# Patient Record
Sex: Male | Born: 1980 | Hispanic: Yes | Marital: Married | Smoking: Never smoker
Health system: Southern US, Community
[De-identification: ages and names within clinical notes are randomized; demographics above are authoritative.]

## PROBLEM LIST (undated history)

## (undated) HISTORY — PX: APPENDECTOMY: SHX54

## (undated) HISTORY — PX: HERNIA REPAIR: SHX51

---

## 2015-01-25 ENCOUNTER — Emergency Department (HOSPITAL_COMMUNITY)
Admission: EM | Admit: 2015-01-25 | Discharge: 2015-01-25 | Disposition: A | Discharge status: Home / Self Care | Payer: Self-pay | Attending: Emergency Medicine | Admitting: Emergency Medicine

## 2015-01-25 ENCOUNTER — Emergency Department (HOSPITAL_COMMUNITY): Payer: Self-pay

## 2015-01-25 ENCOUNTER — Encounter (HOSPITAL_COMMUNITY): Payer: Self-pay | Admitting: Emergency Medicine

## 2015-01-25 DIAGNOSIS — R0602 Shortness of breath: Secondary | ICD-10-CM | POA: Insufficient documentation

## 2015-01-25 DIAGNOSIS — R300 Dysuria: Secondary | ICD-10-CM | POA: Insufficient documentation

## 2015-01-25 DIAGNOSIS — R740 Nonspecific elevation of levels of transaminase and lactic acid dehydrogenase [LDH]: Secondary | ICD-10-CM | POA: Insufficient documentation

## 2015-01-25 DIAGNOSIS — R7401 Elevation of levels of liver transaminase levels: Secondary | ICD-10-CM

## 2015-01-25 DIAGNOSIS — R509 Fever, unspecified: Secondary | ICD-10-CM | POA: Insufficient documentation

## 2015-01-25 DIAGNOSIS — R51 Headache: Secondary | ICD-10-CM | POA: Insufficient documentation

## 2015-01-25 DIAGNOSIS — R1032 Left lower quadrant pain: Secondary | ICD-10-CM | POA: Insufficient documentation

## 2015-01-25 DIAGNOSIS — Z9049 Acquired absence of other specified parts of digestive tract: Secondary | ICD-10-CM | POA: Insufficient documentation

## 2015-01-25 DIAGNOSIS — M549 Dorsalgia, unspecified: Secondary | ICD-10-CM

## 2015-01-25 DIAGNOSIS — M545 Low back pain: Secondary | ICD-10-CM | POA: Insufficient documentation

## 2015-01-25 DIAGNOSIS — R1031 Right lower quadrant pain: Secondary | ICD-10-CM | POA: Insufficient documentation

## 2015-01-25 LAB — CBC WITH DIFFERENTIAL/PLATELET
Basophils Absolute: 0 10*3/uL (ref 0.0–0.1)
Basophils Relative: 1 %
EOS ABS: 0.2 10*3/uL (ref 0.0–0.7)
EOS PCT: 4 %
HCT: 42.9 % (ref 39.0–52.0)
Hemoglobin: 14.8 g/dL (ref 13.0–17.0)
LYMPHS ABS: 2 10*3/uL (ref 0.7–4.0)
Lymphocytes Relative: 39 %
MCH: 30.5 pg (ref 26.0–34.0)
MCHC: 34.5 g/dL (ref 30.0–36.0)
MCV: 88.5 fL (ref 78.0–100.0)
MONO ABS: 0.4 10*3/uL (ref 0.1–1.0)
Monocytes Relative: 8 %
Neutro Abs: 2.5 10*3/uL (ref 1.7–7.7)
Neutrophils Relative %: 48 %
PLATELETS: 219 10*3/uL (ref 150–400)
RBC: 4.85 MIL/uL (ref 4.22–5.81)
RDW: 12.9 % (ref 11.5–15.5)
WBC: 5.1 10*3/uL (ref 4.0–10.5)

## 2015-01-25 LAB — COMPREHENSIVE METABOLIC PANEL
ALT: 89 U/L — ABNORMAL HIGH (ref 17–63)
ANION GAP: 11 (ref 5–15)
AST: 51 U/L — ABNORMAL HIGH (ref 15–41)
Albumin: 4.1 g/dL (ref 3.5–5.0)
Alkaline Phosphatase: 85 U/L (ref 38–126)
BUN: 7 mg/dL (ref 6–20)
CHLORIDE: 100 mmol/L — AB (ref 101–111)
CO2: 25 mmol/L (ref 22–32)
Calcium: 9 mg/dL (ref 8.9–10.3)
Creatinine, Ser: 0.84 mg/dL (ref 0.61–1.24)
GFR calc non Af Amer: >60 mL/min (ref >60)
Glucose, Bld: 108 mg/dL — ABNORMAL HIGH (ref 65–99)
POTASSIUM: 3.6 mmol/L (ref 3.5–5.1)
SODIUM: 136 mmol/L (ref 135–145)
Total Bilirubin: 1.1 mg/dL (ref 0.3–1.2)
Total Protein: 6.7 g/dL (ref 6.5–8.1)

## 2015-01-25 LAB — URINALYSIS, ROUTINE W REFLEX MICROSCOPIC
Bilirubin Urine: NEGATIVE
Glucose, UA: NEGATIVE mg/dL
Ketones, ur: NEGATIVE mg/dL
Leukocytes, UA: NEGATIVE
NITRITE: NEGATIVE
PROTEIN: NEGATIVE mg/dL
SPECIFIC GRAVITY, URINE: 1.01 (ref 1.005–1.030)
UROBILINOGEN UA: 0.2 mg/dL (ref 0.0–1.0)
pH: 6 (ref 5.0–8.0)

## 2015-01-25 LAB — LIPASE, BLOOD: LIPASE: 21 U/L — AB (ref 22–51)

## 2015-01-25 LAB — URINE MICROSCOPIC-ADD ON

## 2015-01-25 MED ORDER — FENTANYL CITRATE (PF) 100 MCG/2ML IJ SOLN
100.0000 ug | Freq: Once | INTRAMUSCULAR | Status: AC
  Administered 2015-01-25: 100 ug via INTRAVENOUS
  Filled 2015-01-25: qty 2

## 2015-01-25 MED ORDER — ONDANSETRON HCL 4 MG/2ML IJ SOLN
4.0000 mg | Freq: Once | INTRAMUSCULAR | Status: AC
  Administered 2015-01-25: 4 mg via INTRAVENOUS
  Filled 2015-01-25: qty 2

## 2015-01-25 MED ORDER — DIAZEPAM 5 MG/ML IJ SOLN
2.5000 mg | Freq: Once | INTRAMUSCULAR | Status: AC
  Administered 2015-01-25: 2.5 mg via INTRAVENOUS
  Filled 2015-01-25: qty 2

## 2015-01-25 MED ORDER — HYDROCODONE-ACETAMINOPHEN 5-325 MG PO TABS: 2.0000 | ORAL_TABLET | ORAL | Status: DC | PRN

## 2015-01-25 MED ORDER — GADOBENATE DIMEGLUMINE 529 MG/ML IV SOLN
15.0000 mL | Freq: Once | INTRAVENOUS | Status: AC | PRN
  Administered 2015-01-25: 15 mL via INTRAVENOUS

## 2015-01-25 MED ORDER — IOHEXOL 300 MG/ML  SOLN
100.0000 mL | Freq: Once | INTRAMUSCULAR | Status: AC | PRN
  Administered 2015-01-25: 100 mL via INTRAVENOUS

## 2015-01-25 MED ORDER — SODIUM CHLORIDE 0.9 % IV SOLN
1000.0000 mL | Freq: Once | INTRAVENOUS | Status: AC
  Administered 2015-01-25: 1000 mL via INTRAVENOUS

## 2015-01-25 MED ORDER — SODIUM CHLORIDE 0.9 % IV SOLN: 1000.0000 mL | INTRAVENOUS | Status: DC

## 2015-01-25 MED ORDER — IBUPROFEN 800 MG PO TABS: 800.0000 mg | ORAL_TABLET | Freq: Three times a day (TID) | ORAL | Status: DC

## 2015-01-25 MED ORDER — METHOCARBAMOL 500 MG PO TABS
500.0000 mg | ORAL_TABLET | Freq: Two times a day (BID) | ORAL | Status: DC | PRN
Start: 2015-01-25 — End: 2015-06-18

## 2015-01-25 NOTE — ED Notes (Signed)
Pt updated on results and plan of care with this RN and MD via interpreter phone. Pt and family verbalized understanding.

## 2015-01-25 NOTE — Discharge Instructions (Signed)
°Emergency Department Resource Guide °1) Find a Doctor and Pay Out of Pocket °Although you won't have to find out who is covered by your insurance plan, it is a good idea to ask around and get recommendations. You will then need to call the office and see if the doctor you have chosen will accept you as a new patient and what types of options they offer for patients who are self-pay. Some doctors offer discounts or will set up payment plans for their patients who do not have insurance, but you will need to ask so you aren't surprised when you get to your appointment. ° °2) Contact Your Local Health Department °Not all health departments have doctors that can see patients for sick visits, but many do, so it is worth a call to see if yours does. If you don't know where your local health department is, you can check in your phone book. The CDC also has a tool to help you locate your state's health department, and many state websites also have listings of all of their local health departments. ° °3) Find a Walk-in Clinic °If your illness is not likely to be very severe or complicated, you may want to try a walk in clinic. These are popping up all over the country in pharmacies, drugstores, and shopping centers. They're usually staffed by nurse practitioners or physician assistants that have been trained to treat common illnesses and complaints. They're usually fairly quick and inexpensive. However, if you have serious medical issues or chronic medical problems, these are probably not your best option. ° °No Primary Care Doctor: °- Call [facility] at  832-8000 - they can help you locate a primary care doctor that  accepts your insurance, provides certain services, etc. °- Physician Referral Service- 1-800-533-3463 ° °Chronic Pain Problems: °Organization         Address  Phone   Notes  °[facility]  (336) 297-2271 Patients need to be referred by their primary care doctor.  ° °Medication  Assistance: °Organization         Address  Phone   Notes  °[place] [facility] [address] °[place], [place] [address] (336) 641-8030 --Must be a resident of [place] °-- Must have NO insurance coverage whatsoever (no Medicaid/ Medicare, etc.) °-- The pt. MUST have a primary care doctor that directs their care regularly and follows them in the community °  °[facility]  (866) 331-1348   °[employer]  (888) 892-1162   ° °Agencies that provide inexpensive medical care: °Organization         Address  Phone   Notes  °[facility]  (336) 832-8035   °[facility]    (336) 832-7272   °[facility] [address] °[place], [place] [address] (336) 832-4777   °[facility] [address], °[place] (336) 271-4999   °[facility]    (336) 373-0678   °[facility]    (336) 272-1050   °[facility] ° [address], [place] Phone:  (336) 832-4444, Fax:  (336) 832-4440 Hours of Operation:  9 am - 6 pm, M-F.  Also accepts Medicaid/Medicare and self-pay.  °[facility] ° [address], [place] Phone: (336) 832-3150, Fax: (336) [phone]. Hours of Operation:  8:30 am - 5:30 pm, M-F.  Also accepts Medicaid and self-pay.  °[facility] [place] [address], [place] Phone: (336) 878-6027   °[facility] [address], [place], [place] (336)723-1848, Ext. 123 Mondays & Thursdays: 7-9 AM.  First 15 patients are seen on a first come, first serve basis. °  ° °Medicaid-accepting [place] Providers: ° °Organization         Address  Phone   Notes  °[facility] [address], [place] (336) 641-2100 Also accepts self-pay patients.  °[facility] [address], [place] ° (336) 856-9996   °[facility] [address], [place]  (336) 288-8857   °[facility] [address] [place] [address], [place] (336) 299-7000   °Veita Bland [address], [place]  ° (336) 373-1557 Only accepts [place] Access Medicaid patients after they have their name applied to their card.  ° °Self-Pay (no insurance) in [place]: ° °Organization         Address  Phone   Notes  °Sickle Cell Patients, [facility] [address], [place] (336) 832-1970   °[facility] [address], [place] (336) 832-4400   °[facility] ° [address], [place] (336) 992-4800   °[facility]/Dr. Osei-Bonsu ° [address] [place] [address], [place] or [address], [place] (336) 841-8500 Phone number for both [place] and [place] locations is the same.  °[facility] [address], [place] (336) 299-0000   °[facility] [address] [place] [address], [place] or [address] (336) 852-7530 °(336) 878-2260   °[facility] [address], [place] (336) 350-1642, phone; (336) 294-5005, fax Sees patients 1st and 3rd Saturday of every month.  Must not qualify for public or private insurance (i.e. Medicaid, Medicare, [place] Health Choice, Veterans' Benefits) • Household income should be no more than 200% of the poverty level •The clinic cannot treat you if you are pregnant or think you are pregnant • Sexually transmitted diseases are not treated at the clinic.  ° ° °Dental Care: °Organization         Address  Phone  Notes  °[place] [facility] [address], [place] (336) 641-6152 Accepts children up to age 21 who are enrolled in Medicaid or [place] Health Choice; pregnant women with a Medicaid card; and children who have applied for Medicaid or [place] Health Choice, but were declined, whose parents can pay a reduced fee at time of service.  °[place]  [facility] [place]  [address], [place] (336) 641-7733 Accepts children up to age 21 who are enrolled in Medicaid or [place] Health Choice; pregnant women with a Medicaid card; and children who have applied for Medicaid or [place] Health Choice, but were declined, whose parents can pay a reduced fee at time of service.  °[facility] ° [address], [place] (336) 641-4533 Patients are seen by appointment only. Walk-ins are not accepted. [facility] will see patients 18 years of age and older. °Monday - Tuesday (8am-5pm) °Most Wednesdays (8:30-5pm) °$30 per visit, cash only  °[facility] ° [address], [place] (336) 641-4533 Patients are seen by appointment only. Walk-ins are not accepted. [facility] will see patients 18 years of age and older. °One   Wednesday Evening (Monthly: Volunteer Based).  $30 per visit, cash only  °[facility]  (919) 537-3737 for adults; Children under age 4, call Graduate Pediatric Dentistry at ([phone]) [phone]. Children aged 4-14, please call ([phone]) [phone] to request a pediatric application. ° Dental services are provided in all areas of dental care including fillings, crowns and bridges, complete and partial dentures, implants, gum treatment, root canals, and extractions. Preventive care is also provided. Treatment is provided to both adults and children. °Patients are selected via a lottery and there is often a waiting list. °  °[facility] [address], °[place] ° (336) 763-8833 [email or web address] °  °[facility] [address], [place], [place] (336)723-1848, Ext. 123 Second and Fourth Thursday of each month, opens at 6:30 AM; Clinic ends at 9 AM.  Patients are seen on a first-come first-served basis, and a limited number are seen during each clinic.  ° °[facility] ° [address], [place], [place] (336) 723-7904    Eligibility Requirements °You must have lived in [place], [place], or [place] counties for at least the last three months. °  You cannot be eligible for state or federal sponsored healthcare insurance, including Veterans Administration, Medicaid, or Medicare. °  You generally cannot be eligible for healthcare insurance through your employer.  °  How to apply: °Eligibility screenings are held every Tuesday and Wednesday afternoon from 1:00 pm until 4:00 pm. You do not need an appointment for the interview!  °[facility] [address], [place], [place] 336-631-2330   °[facility]  336-342-8273   °[facility]  336-703-3100   °[facility]  336-570-6415   ° °Behavioral Health Resources in the Community: °Intensive Outpatient Programs °Organization         Address  Phone  Notes  °[facility] [address], [place], [place] 336-878-6098   °[facility] [address], [place], [place] 336-832-9800   °[facility] [address], [place], [place] ° 336-882-2125   °[facility] [address],  °[place], [place] 1-800-853-5163 or 336-641-4981   °Substance Abuse Resources °Organization         Address  Phone  Notes  °[facility]  336-882-2125   °[facility]  336-784-9470   °[facility]  336-285-9073   °[facility]  336-845-3988   °Residential & Outpatient Substance Abuse Program  1-800-659-3381   °Psychological Services °Organization         Address  Phone  Notes  °[facility]  336- 832-9600   °[facility]  336- 378-7881   °[facility] [address], [place] 1-800-853-5163 or 336-641-4981   ° °Mobile Crisis Teams °Organization         Address  Phone  Notes  °[facility], Mobile Crisis Care Unit  1-877-626-1772   °Assertive °[facility] ° [address]  [place], [place] 336-834-9664   °Sharon DeEsch [address] °[place] 336-554-5454   ° °Self-Help/Support Groups °Organization         Address  Phone             Notes  °[facility] of  - variety of support groups  336- 373-1402 Call for more information  °Narcotics Anonymous (NA), [facility] [address], °[place]  2 meetings at this location  ° °  Residential Treatment Programs Organization         Address  Phone  Notes  [facility] [address],    [place]  1-308-657-84691-(720) 062-1777   [facility]  [address], [place] [phone], [place], [place] [phone]   [facility] [address], [place] [phone] Admissions: 8am-3pm M-F  [facility] [address]    [place], [place] [phone]   [facility] [address], [place], [place] [phone]   [facility] [address]  [place], [place] [phone]   [facility] Intensive Outpatient [address], [place], [place] [phone]   [facility] [address]  [place], [place] [phone] or [phone]   [facility] [address] [place], [place] [phone] Accepts Medicaid  [facility] [address]  [place] [phone] Substance Abuse/Addiction Treatment   [facility]         Address  Phone  Notes  [facility]  470-448-3881(888) 365-402-2384   Angie FavaJulie Brannon, PhD [address], [address] [place], [place]   [phone](336) [phone] or (707)[phone](336) [phone]   [facility]   [address] [place], [place] [phone](336) [phone]   [facility] [address], [place], [place] [phone]) [phone] Insurance/Medicaid/sponsorship through [facility] [address]                                    [place], [place] [phone]) [phone] Therapy/tele-psych/case    [facility] [address]   [place], [place] [phone](336) [phone]    Dr. Lolly MustacheArfeen  [phone](336) [phone]   [facility] [facility]  [employer] [facility] 1) [address], [place] 2) [address], [place] 3)  [address], [place] [phone](336) [phone](336) [phone]  [phone]) [phone]   [facility] Child Abuse [facility] (910) 832-9004(336) 647-711-4391 or (424)[phone](336) 5635088188 (After Hours)      Please obtain all of your results from medical records or have your doctors office obtain the results - share them with your doctor - you should be seen at your doctors office in the next 2 days. Call today to arrange your follow up. Take the medications as prescribed. Please review all of the medicines and only take them if you do not have an allergy to them. Please be aware that if you are taking birth control pills, taking other prescriptions, ESPECIALLY ANTIBIOTICS may make the birth control ineffective - if this is the case, either do not engage in sexual activity or use alternative methods of birth control such as condoms until you have finished the medicine and your family doctor says it is OK to restart them. If you are on a blood thinner such as COUMADIN, be aware that any other medicine that you take may cause the coumadin to either work too much, or not enough - you should have your coumadin level rechecked in next 7 days if this is the case.  ?  It is also a possibility that you have an allergic reaction to any of the medicines that you have been prescribed - Everybody reacts differently to medications and while MOST people have no trouble with most medicines, you may have a reaction such as nausea, vomiting, rash, swelling, shortness of breath.  If this is the case, please stop taking the medicine immediately and contact your physician.  ?  You should return to the ER if you develop severe or worsening symptoms.

## 2015-01-25 NOTE — ED Notes (Addendum)
Pt from home and translator line used: pt states lower back pain x4 weeks with radiatio to legs. Pt also reports increased 2 weeks ago started to notice urination, dark urine production, and dysuria. Pt reports nausea but denies any emesis or diarrhea. Pt also endorsed LLQ abd pain that is tender to palpation. nad noted.

## 2015-01-25 NOTE — ED Notes (Signed)
Patient transported to MRI 

## 2015-01-25 NOTE — ED Notes (Signed)
Pt urinated 90 ml, post bladder scan 20 ml residual noted by NT

## 2015-01-25 NOTE — ED Provider Notes (Signed)
CSN: [id number]     Arrival date & time 01/25/15  1123 History   First MD Initiated Contact with Patient 01/25/15 1134     Chief Complaint  Patient presents with  . Back Pain     (Consider location/radiation/quality/duration/timing/severity/associated sxs/prior Treatment) Patient is a 34 y.o. male presenting with back pain.  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 No pertinent past medical history. Past Surgical History  Procedure Laterality Date  . Appendectomy    . Hernia repair     No family history on file. Social History  Substance Use Topics  . Smoking status: Never Smoker   . Smokeless tobacco: None  . Alcohol Use: No    Review of Systems  Constitutional: Positive for fever. Negative for chills.  HENT: Negative for congestion and rhinorrhea.   Eyes: Negative for visual disturbance.  Respiratory: Positive for shortness of breath. Negative for cough.   Cardiovascular: Negative for chest pain.  Gastrointestinal: Positive for abdominal pain. Negative for vomiting, diarrhea and constipation.  Endocrine: Negative for polyuria.  Genitourinary: Positive for dysuria. Negative for bladder incontinence, flank pain and pelvic pain.  Musculoskeletal: Positive for back pain. Negative for neck pain.  Skin: Negative for rash and wound.  Neurological: Positive for headaches. Negative for dizziness, weakness and numbness.      Allergies  Review  of patient's allergies indicates no known allergies.  Home Medications   Prior to Admission medications   Not on File   BP 133/75 mmHg  Pulse 75  Temp(Src) 98.6 F (37 C) (Oral)  Resp 16  SpO2 99% Physical Exam  Constitutional: He is oriented to person, place, and time. He appears well-developed and well-nourished.  HENT:  Head: Normocephalic and atraumatic.  Eyes: Conjunctivae and EOM are normal.  Neck: Normal range of motion. Neck supple.  Cardiovascular: Normal rate and regular rhythm.   Pulmonary/Chest: Effort normal. No respiratory distress.  Abdominal: Soft. There is tenderness (llq and rlq).  Musculoskeletal: Normal range of motion. He exhibits no edema or tenderness.  L>R cva ttp, midline lumbar ttp  Neurological: He is alert and oriented to person, place, and time.  Skin: Skin is warm and dry.  Nursing note and vitals reviewed.   ED Course  Procedures (including critical care time) Labs Review Labs Reviewed - No data to display  Imaging Review No results found. I have personally reviewed and evaluated these images and lab results as part of my medical decision-making.   EKG Interpretation None      MDM   Final diagnoses:  None   34 yo M w/ 4 weeks of indolent, progressively worsening back pain and subjective fevers. Increased urinary frequency, decreased total output. No neurologic symptoms but seems to have decreased sensation on left leg light touch. Normal reflexes and motor otherwise. Abdomen with some ttp in bilateral lower quadrants. Initial ddx was space occupying lesion in back v pyelonephritis v intraabdominal pathology such as appendicitis so started with  labs and ct scan which were negative. Pain meds improved symptoms some, valium given in case it was muscular. 2/2 triad of back pain, subjective fever and urinary changes and no other explanation I ordered MRI to eval for cauda equina from epidural abscess v osteomyelitis, discitis, etc. This was  pending at time of transfer of care. Plan is to follow up MRI results and pain improvement. If these are both ok, patient stable for discharge with symptomatic treatment.   Care to Dr. Eber Hong at approximately [phone] pending MRI results.     Marily Memos, MD 01/26/15 [phone]

## 2015-01-25 NOTE — ED Provider Notes (Signed)
Pain improved, patient informed of results, MRI shows no acute findings, stable for discharge  He was given a copy of all of his results as well as a resource list for close follow-up.  Meds given in ED:   New Prescriptions   HYDROCODONE-ACETAMINOPHEN (NORCO/VICODIN) 5-325 MG TABLET    Take 2 tablets by mouth every 4 (four) hours as needed.   IBUPROFEN (ADVIL,MOTRIN) 800 MG TABLET    Take 1 tablet (800 mg total) by mouth 3 (three) times daily.   METHOCARBAMOL (ROBAXIN) 500 MG TABLET    Take 1 tablet (500 mg total) by mouth 2 (two) times daily as needed for muscle spasms.      Eber Hong, MD 01/25/15 2021

## 2015-01-25 NOTE — ED Notes (Signed)
Patient transported to X-ray 

## 2015-01-27 MED ORDER — GADOBENATE DIMEGLUMINE 529 MG/ML IV SOLN
20.0000 mL | Freq: Once | INTRAVENOUS | Status: AC | PRN
  Administered 2015-01-25: 20 mL via INTRAVENOUS

## 2015-06-18 ENCOUNTER — Emergency Department (HOSPITAL_COMMUNITY): Payer: Self-pay

## 2015-06-18 ENCOUNTER — Encounter (HOSPITAL_COMMUNITY): Payer: Self-pay | Admitting: Family Medicine

## 2015-06-18 ENCOUNTER — Emergency Department (HOSPITAL_COMMUNITY)
Admission: EM | Admit: 2015-06-18 | Discharge: 2015-06-18 | Disposition: A | Discharge status: Home / Self Care | Payer: Self-pay | Attending: Emergency Medicine | Admitting: Emergency Medicine

## 2015-06-18 DIAGNOSIS — R0789 Other chest pain: Secondary | ICD-10-CM | POA: Insufficient documentation

## 2015-06-18 DIAGNOSIS — R109 Unspecified abdominal pain: Secondary | ICD-10-CM | POA: Insufficient documentation

## 2015-06-18 DIAGNOSIS — R319 Hematuria, unspecified: Secondary | ICD-10-CM | POA: Insufficient documentation

## 2015-06-18 DIAGNOSIS — R0781 Pleurodynia: Secondary | ICD-10-CM

## 2015-06-18 DIAGNOSIS — R110 Nausea: Secondary | ICD-10-CM | POA: Insufficient documentation

## 2015-06-18 DIAGNOSIS — R059 Cough, unspecified: Secondary | ICD-10-CM

## 2015-06-18 DIAGNOSIS — Z791 Long term (current) use of non-steroidal anti-inflammatories (NSAID): Secondary | ICD-10-CM | POA: Insufficient documentation

## 2015-06-18 DIAGNOSIS — R05 Cough: Secondary | ICD-10-CM | POA: Insufficient documentation

## 2015-06-18 DIAGNOSIS — R300 Dysuria: Secondary | ICD-10-CM | POA: Insufficient documentation

## 2015-06-18 LAB — COMPREHENSIVE METABOLIC PANEL
ALK PHOS: 92 U/L (ref 38–126)
ALT: 37 U/L (ref 17–63)
ANION GAP: 12 (ref 5–15)
AST: 28 U/L (ref 15–41)
Albumin: 4.3 g/dL (ref 3.5–5.0)
BILIRUBIN TOTAL: 1.5 mg/dL — AB (ref 0.3–1.2)
BUN: 9 mg/dL (ref 6–20)
CALCIUM: 9.5 mg/dL (ref 8.9–10.3)
CO2: 25 mmol/L (ref 22–32)
Chloride: 104 mmol/L (ref 101–111)
Creatinine, Ser: 0.89 mg/dL (ref 0.61–1.24)
GLUCOSE: 82 mg/dL (ref 65–99)
POTASSIUM: 3.5 mmol/L (ref 3.5–5.1)
Sodium: 141 mmol/L (ref 135–145)
TOTAL PROTEIN: 7.3 g/dL (ref 6.5–8.1)

## 2015-06-18 LAB — URINE MICROSCOPIC-ADD ON

## 2015-06-18 LAB — LIPASE, BLOOD: Lipase: 25 U/L (ref 11–51)

## 2015-06-18 LAB — CBC
HEMATOCRIT: 43.8 % (ref 39.0–52.0)
HEMOGLOBIN: 14.9 g/dL (ref 13.0–17.0)
MCH: 29.7 pg (ref 26.0–34.0)
MCHC: 34 g/dL (ref 30.0–36.0)
MCV: 87.3 fL (ref 78.0–100.0)
Platelets: 258 10*3/uL (ref 150–400)
RBC: 5.02 MIL/uL (ref 4.22–5.81)
RDW: 12.7 % (ref 11.5–15.5)
WBC: 4.7 10*3/uL (ref 4.0–10.5)

## 2015-06-18 LAB — URINALYSIS, ROUTINE W REFLEX MICROSCOPIC
BILIRUBIN URINE: NEGATIVE
Glucose, UA: NEGATIVE mg/dL
Hgb urine dipstick: NEGATIVE
KETONES UR: NEGATIVE mg/dL
LEUKOCYTES UA: NEGATIVE
NITRITE: NEGATIVE
PH: 7.5 (ref 5.0–8.0)
Protein, ur: 30 mg/dL — AB
SPECIFIC GRAVITY, URINE: 1.02 (ref 1.005–1.030)

## 2015-06-18 MED ORDER — HYDROCODONE-ACETAMINOPHEN 5-325 MG PO TABS: 1.0000 | ORAL_TABLET | ORAL | Status: DC | PRN

## 2015-06-18 MED ORDER — NAPROXEN 500 MG PO TABS: 500.0000 mg | ORAL_TABLET | Freq: Two times a day (BID) | ORAL | Status: DC

## 2015-06-18 MED ORDER — MORPHINE SULFATE (PF) 4 MG/ML IV SOLN
4.0000 mg | Freq: Once | INTRAVENOUS | Status: AC
Start: 2015-06-18 — End: 2015-06-18
  Administered 2015-06-18: 4 mg via INTRAVENOUS
  Filled 2015-06-18: qty 1

## 2015-06-18 MED ORDER — ONDANSETRON HCL 4 MG/2ML IJ SOLN
4.0000 mg | Freq: Once | INTRAMUSCULAR | Status: AC
  Administered 2015-06-18: 4 mg via INTRAVENOUS
  Filled 2015-06-18: qty 2

## 2015-06-18 MED ORDER — CYCLOBENZAPRINE HCL 10 MG PO TABS: 10.0000 mg | ORAL_TABLET | Freq: Two times a day (BID) | ORAL | Status: DC | PRN

## 2015-06-18 NOTE — ED Notes (Signed)
Translator used to go over discharge instructions.   

## 2015-06-18 NOTE — ED Notes (Signed)
Pt here for left flank pain/abd pain x 3 days. sts hurts when he takes a deep breath and moves. sts he has been coughing.

## 2015-06-18 NOTE — ED Notes (Signed)
Patient transported to CT 

## 2015-06-18 NOTE — ED Notes (Signed)
Patient transported to X-ray 

## 2015-06-18 NOTE — ED Provider Notes (Signed)
CSN: [id number]     Arrival date & time 06/18/15  1804 History   First MD Initiated Contact with Patient 06/18/15 2020     Chief Complaint  Patient presents with  . Flank Pain     (Consider location/radiation/quality/duration/timing/severity/associated sxs/prior Treatment) HPI Social 35 year old male who presents emergency Department with chief complaint of left flank and rib pain. He is Spanish-speaking and an interpreter is used. The patient states that he has had a cough. He developed pain in his left flank "above the kidney." He states that he has had some intermittent bouts of severe pain. At times it is stabbing and burning in sensation. He also has pain in the left anterior chest wall. He also complains of dysuria today. During his emergency visit. This pain has been uncontrolled with over-the-counter medications. He denies history of kidney stones, fevers, chills, vomiting. He did have nausea yesterday. He has not noticed any hematuria, or urgency or frequency to urinate History reviewed. No pertinent past medical history. Past Surgical History  Procedure Laterality Date  . Appendectomy    . Hernia repair     History reviewed. No pertinent family history. Social History  Substance Use Topics  . Smoking status: Never Smoker   . Smokeless tobacco: None  . Alcohol Use: No    Review of Systems Ten systems reviewed and are negative for acute change, except as noted in the HPI.     Allergies  Review of patient's allergies indicates no known allergies.  Home Medications   Prior to Admission medications   Medication Sig Start Date End Date Taking? Authorizing Provider  HYDROcodone-acetaminophen (NORCO/VICODIN) 5-325 MG tablet Take 2 tablets by mouth every 4 (four) hours as needed. 01/25/15   Eber Hong, MD  ibuprofen (ADVIL,MOTRIN) 800 MG tablet Take 1 tablet (800 mg total) by mouth 3 (three) times daily. 01/25/15   Eber Hong, MD  methocarbamol (ROBAXIN) 500 MG tablet Take 1  tablet (500 mg total) by mouth 2 (two) times daily as needed for muscle spasms. 01/25/15   Eber Hong, MD   BP 109/76 mmHg  Pulse 83  Temp(Src) 98.1 F (36.7 C) (Oral)  Resp 18  SpO2 98% Physical Exam  Constitutional: He appears well-developed and well-nourished. No distress.  HENT:  Head: Normocephalic and atraumatic.  Eyes: Conjunctivae are normal. No scleral icterus.  Neck: Normal range of motion. Neck supple.  Cardiovascular: Normal rate, regular rhythm and normal heart sounds.   Pulmonary/Chest: Effort normal and breath sounds normal. No respiratory distress. He exhibits tenderness.  Abdominal: Soft. He exhibits no distension (tender to palpation over the left anterior chest wall) and no mass. There is tenderness (left CVA tenderness). There is no guarding.  Musculoskeletal: He exhibits no edema.  Neurological: He is alert.  Skin: Skin is warm and dry. He is not diaphoretic.  Psychiatric: His behavior is normal.  Nursing note and vitals reviewed.   ED Course  Procedures (including critical care time) Labs Review Labs Reviewed  COMPREHENSIVE METABOLIC PANEL - Abnormal; Notable for the following:    Total Bilirubin 1.5 (*)    All other components within normal limits  URINALYSIS, ROUTINE W REFLEX MICROSCOPIC (NOT AT [facility]) - Abnormal; Notable for the following:    Color, Urine AMBER (*)    Protein, ur 30 (*)    All other components within normal limits  URINE MICROSCOPIC-ADD ON - Abnormal; Notable for the following:    Squamous Epithelial / LPF 0-5 (*)    Bacteria, UA MANY (*)  All other components within normal limits  LIPASE, BLOOD  CBC    Imaging Review No results found. I have personally reviewed and evaluated these images and lab results as part of my medical decision-making.   EKG Interpretation None      MDM   Final diagnoses:  Rib pain on left side  Hematuria  Cough    10:54 PM BP 109/76 mmHg  Pulse 83  Temp(Src) 98.1 F (36.7 C) (Oral)   Resp 18  SpO2 98% Patient's imaging is negative.  This likelyu represents musculoskeletal pain.  He is PERC negative. Will dc with symptomatic relief and discussed return precautions.   Arthor Captain, PA-C 06/20/15 2135  Arby Barrette, MD 06/29/15 [phone]

## 2015-06-18 NOTE — Discharge Instructions (Signed)
Dolor en la pared torcica (Chest Wall Pain) El dolor en la pared torcica se produce en los huesos y los msculos del pecho o alrededor de Scientist, product/process development. A veces, una lesin Occupational psychologist. En ocasiones, la causa puede ser desconocida. Este dolor puede durar varias semanas. INSTRUCCIONES PARA EL CUIDADO EN EL HOGAR  Est atento a cualquier cambio en los sntomas. Tome estas medidas para Acupuncturist dolor:   Haga reposo como se lo haya indicado el mdico.   Evite las actividades que causan dolor. Estas pueden ser [employer] requieren el uso de los msculos del trax, los abdominales o los laterales para levantar objetos pesados.   Si se lo indican, aplique hielo sobre la zona dolorida:  Ponga el hielo en una bolsa plstica.  Coloque una toalla entre la piel y la bolsa de hielo.  Coloque el hielo durante , 2 a 3veces por Futures trader.  Tome los medicamentos de venta libre y los recetados solamente como se lo haya indicado el mdico.  No consuma productos que contengan tabaco, incluidos cigarrillos, tabaco de Theatre manager y Administrator, Civil Service. Si necesita ayuda para dejar de fumar, consulte al mdico.  Concurra a todas las visitas de control como se lo haya indicado el mdico. Esto es importante. SOLICITE ATENCIN MDICA SI:  Lance Muss.  El dolor de [place].  Aparecen nuevos sntomas. SOLICITE ATENCIN MDICA DE INMEDIATO SI:  Tiene nuseas o vmitos.  Berenice Primas o tiene sensacin de desvanecimiento.  Tiene tos con flema (esputo) o expectora sangre al toser.  Le falta el aire.   Esta informacin no tiene Theme park manager el consejo del mdico. Asegrese de hacerle al mdico cualquier pregunta que tenga.   Document Released: 05/17/2006 Document Revised: 12/25/2014 Elsevier Interactive Patient Education 2016 [employer].  Tos en los adultos (Cough, Adult) La tos es un reflejo que limpia la garganta y las vas respiratorias, y ayuda a la curacin y Training and development officer  de los pulmones. Es normal toser de Teacher, English as a foreign language, pero cuando esta se presenta con otros sntomas o dura mucho tiempo puede ser el signo de una enfermedad que [place]. La tos puede durar solo 2 o 3semanas (aguda) o ms de 8semanas (crnica). CAUSAS Comnmente, las causas de la tos son las siguientes:  Visual merchandiser sustancias que [employer].  Una infeccin respiratoria viral o bacteriana.  Alergias.  Asma.  Goteo posnasal.  Fumar.  El retroceso de cido estomacal hacia el esfago (reflujo gastroesofgico).  Algunos medicamentos.  Los problemas pulmonares crnicos, entre ellos, la enfermedad pulmonar obstructiva crnica (EPOC) (o, en contadas ocasiones, el cncer de pulmn).  Otras afecciones, como la insuficiencia cardaca. INSTRUCCIONES PARA EL CUIDADO EN EL HOGAR  Est atento a cualquier cambio en los sntomas. Tome estas medidas para Paramedic las molestias:  Tome los medicamentos solamente como se lo haya indicado el mdico.  Si le recetaron un antibitico, tmelo como se lo haya indicado el mdico. No deje de tomar los antibiticos aunque comience a sentirse mejor.  Hable con el mdico antes de tomar un antitusivo.  Beba suficiente lquido para Photographer orina clara o de color amarillo plido.  Si el aire est seco, use un vaporizador o un humidificador con vapor fro en su habitacin o en su casa para ayudar a aflojar las secreciones.  Evite todas las cosas que le producen tos en el trabajo o en su casa.  Si la tos aumenta durante la noche, intente dormir semisentado.  Evite el humo del cigarrillo. Si  fuma, deje de hacerlo. Si necesita ayuda para dejar de fumar, consulte al mdico.  Evite la cafena.  Evite el alcohol.  Descanse todo lo que sea necesario. SOLICITE ATENCIN MDICA SI:   Aparecen nuevos sntomas.  Expectora pus al toser.  La tos no mejora despus de 2 o 3semanas, o empeora.  No puede controlar la tos con antitusivos y no  puede dormir bien.  Tiene un dolor que se intensifica o que no puede Sales promotion account executive.  Tiene fiebre.  [place] de peso sin causa aparente.  Tiene transpiracin nocturna. SOLICITE ATENCIN MDICA DE INMEDIATO SI:  Tose y escupe sangre.  Tiene dificultad para respirar.  Los latidos cardacos son muy rpidos.   Esta informacin no tiene Theme park manager el consejo del mdico. Asegrese de hacerle al mdico cualquier pregunta que tenga.   Document Released: 11/11/2010 Document Revised: 12/25/2014 Elsevier Interactive Patient Education 2016 [employer].  Hematuria - Adultos (Hematuria, Adult) La hematuria es la presencia de sangre en la orina. La causa puede ser una infeccin en la vejiga, en los riones, en la prstata, clculos renales o cncer de las vas urinarias. Normalmente las infecciones pueden tratarse con medicamentos, y los clculos renales, por lo general, se eliminarn a travs de la orina. Si ninguna de estas es la causa de su hematuria, quizs se necesiten ms estudios para Building services engineer. Es muy importante que le informe a su mdico si ve sangre en la [place], aunque el sangrado se detenga sin tratamiento o no cause dolor. La sangre que aparece en la orina y luego se detiene y vuelve a aparecer nuevamente puede ser un sntoma de una enfermedad muy grave. Adems, el dolor no es un sntoma en las etapas iniciales de muchos tipos de cncer urinarios. INSTRUCCIONES PARA EL CUIDADO EN EL HOGAR   Beba mucho lquido, entre 3 a Risk analyst. Si le han diagnosticado una infeccin, se recomienda especialmente el jugo de arndanos rojos, [address] de grandes cantidades de [place].  Evite la cafena, el t y las bebidas con gas porque suelen irritar la vejiga.  Evite el alcohol ya que puede Teacher, adult education.  Tome todos los [employer] se lo haya indicado el mdico.  Si le recetaron antibiticos, asegrese de terminarlos, incluso si comienza a sentirse  mejor.  Si le han diagnosticado clculos renales, siga las instrucciones de su mdico con respecto a Ecologist la orina para TEFL teacher clculo.  Vace la vejiga con frecuencia. Evite retener la orina durante largos perodos.  Despus de defecar, las mujeres deben higienizarse desde adelante hacia atrs. Use solo un papel por vez.  Si es [employer], vace la vejiga antes y despus de Management consultant. SOLICITE ATENCIN MDICA SI:  Siente dolor en la espalda.  Tiene fiebre.  Tiene sensacin de Programme researcher, broadcasting/film/video (nuseas) o vmitos.  Los sntomas no mejoran despus de [address]. Si la condicin empeora, visite al mdico antes de lo previsto. SOLICITE ATENCIN MDICA DE INMEDIATO SI:   Vomita con frecuencia e intensamente y no puede tolerar los medicamentos.  Siente un dolor intenso en la espalda o el abdomen incluso tomando los medicamentos.  Elimina cogulos grandes o sangre con la [place].  Se siente extremadamente dbil, se desmaya o pierde el conocimiento. ASEGRESE DE QUE:   Comprende estas instrucciones.  Controlar su afeccin.  Recibir ayuda de inmediato si no mejora o si empeora.   Esta informacin no tiene Theme park manager el consejo del mdico. Asegrese de hacerle al  mdico cualquier pregunta que tenga.   Document Released: 04/05/2005 Document Revised: 04/26/2014 Elsevier Interactive Patient Education 2016 Elsevier Inc. Naproxen and naproxen sodium oral immediate-release tablets Qu es este medicamento? El NAPROXENO es un medicamento antiinflamatorio no esteroideo (AINE). Se utiliza para reducir la inflamacin y Corporate treasurer. Este medicamento se puede Chemical engineer para Nurse, adult, dolor de [place] y perodos menstruales dolorosos. Este medicamento tambin sirve para tratar problemas dolorosos de las articulaciones y los msculos, tales como artritis, tendinitis, bursitis y Secondary school teacher. Este medicamento puede ser utilizado para otros usos; si tiene alguna  pregunta consulte con su proveedor de atencin mdica o con su farmacutico. Qu le debo informar a mi profesional de la salud antes de tomar este medicamento? Necesita saber si usted presenta alguno de los siguientes problemas o situaciones: -asma -fuma -consume ms de 3 bebidas alcohlicas por da -enfermedad cardiaca o problemas circulatorios, tales como insuficiencia cardiaca o edema de pierna (retencin de lquido) -alta presin sangunea -enfermedad renal -enfermedad heptica -lceras o sangrado estomacal -una reaccin alrgica o inusual al naproxeno, a la aspirina, a otros AINE, otros medicamentos, alimentos, colorantes o conservantes -si est embarazada o buscando quedar embarazada -si est amamantando a un beb Cmo debo [employer]? Tome este medicamento por va oral con un vaso de agua. Siga las instrucciones de la etiqueta del [place]. Si este medicamento le produce Programme researcher, broadcasting/film/video, tmelo con alimentos. Trate de no acostarse por lo menos 10 minutos despus de tomarlo. Tome sus dosis a intervalos regulares. No tome su medicamento con una frecuencia mayor a la indicada. El uso prolongado puede aumentar el riesgo de sufrir un ataque cardiaco o un derrame cerebral. Su farmacutico le dar una [place] del medicamento especial con cada receta y relleno. Asegrese de leer esta informacin cada vez cuidadosamente. Hable con su pediatra para informarse acerca del uso de este medicamento en nios. Puede requerir atencin especial. Sobredosis: Pngase en contacto inmediatamente con un centro toxicolgico o una sala de urgencia si usted cree que haya tomado demasiado medicamento. ATENCIN: [employer] es solo para usted. No comparta este medicamento con nadie. Qu sucede si me olvido de una dosis? Si olvida una dosis, tmela lo antes posible. Si es casi la hora de la prxima dosis, tome slo esa dosis. No tome dosis adicionales o dobles. Qu puede interactuar con  este medicamento? -alcohol -aspirina -cidofovir -diurticos -litio -metotrexato -otros medicamentos antiinflamatorios, tales Teacher, English as a foreign language o prednisona -pemetrexed -probenecid -warfarina Puede ser que esta lista no menciona todas las posibles interacciones. Informe a su profesional de [employer] de [employer] productos a base de hierbas, medicamentos de [place] o suplementos nutritivos que est tomando. Si usted fuma, consume bebidas alcohlicas o si utiliza drogas ilegales, indqueselo tambin a su profesional de [employer]. Algunas sustancias pueden interactuar con su medicamento. A qu debo estar atento al usar [employer]? Si el dolor no mejora, informe a su mdico o a su profesional de [employer]. Consulte con su mdico antes de tomar otros analgsicos. No se trate usted mismo. Este medicamento no previene ataques cardiacos o derrames cerebrales. De hecho, este medicamento puede aumentar la posibilidad de Marine scientist un ataque cardiaco o un derrame cerebral. La posibilidad puede aumentar con el uso prolongado de este medicamento y en pacientes con enfermedad cardiaca. Si est tomando aspirina para la prevencin de ataques cardiacos o derrames cerebrales, comunquese con su mdico o su profesional de [employer]. Evite tomar otros medicamentos que contienen aspirina, ibuprofeno o naproxeno con [employer].  Es probable que se Special educational needs teacher secundarios, tales como molestias estomacales, nuseas o lceras. No debe de tomar [employer] con muchos medicamentos disponibles de [employer]. Este medicamento puede provocar lceras y hemorragia del estmago e intestinos en cualquier momento durante tratamiento. No fume ni ingiera alcohol. Esto irrita an ms el estmago y puede hacerlo ms susceptible a dao por el uso de [employer]. Pueden ocurrir lceras y hemorragia sin sntomas de Control and instrumentation engineer y Financial controller la [place]. Puede experimentar mareos o somnolencia. No conduzca ni utilice  maquinaria ni haga nada que Scientist, research (life sciences) en estado de alerta hasta que sepa cmo le afecta este medicamento. No se siente ni se ponga de pie con rapidez, especialmente si es un paciente de edad avanzada. Esto reduce el riesgo de mareos o [employer]. Este medicamento puede hacerle sangrar con mayor facilidad. Trate de no lastimarse los dientes y las encas al cepillarlos o limpiarlos con hilo dental. Qu efectos secundarios puedo tener al [employer] este medicamento? Efectos secundarios que debe informar a su mdico o a Producer, television/film/video de la salud tan pronto como sea posible: -heces de color oscuro o con sangre, sangre en la orina o vmito con sangre -visin borrosa -dolor en el pecho -dificultad al respirar o sibilancias -nuseas o vmito -dolor de estmago severo -erupcin cutnea, enrojecimiento, ampollas o descamacin de la piel, urticarias o picazn -hablar arrastrando las palabras o debilidad en un lado del cuerpo -hinchazn de prpados, garganta o labios -aumento de peso o hinchazn que no tienen explicacin -cansancio o debilidad inusual -color amarillento de los ojos o la piel Efectos secundarios que, por lo general, no requieren atencin mdica (debe informarlos a su mdico o a su profesional de la salud si persisten o si son molestos): -estreimiento -dolor de cabeza -acidez de estmago Puede ser que esta lista no menciona todos los posibles efectos secundarios. Comunquese a su mdico por asesoramiento mdico [employer]. Usted puede informar los efectos secundarios a la FDA por telfono al 1-800-FDA-1088. Dnde debo guardar mi medicina? Mantngala fuera del alcance de los nios. Gurdela a [employer], entre 15 y 30 grados C (10 y 7 grados F). Mantenga le envase bien cerrado. Deseche todo el medicamento que no haya utilizado, despus de la fecha de vencimiento. ATENCIN: Este folleto es un resumen. Puede ser que no cubra toda la posible informacin.  Si usted tiene preguntas acerca de esta medicina, consulte con su mdico, su farmacutico o su profesional de Radiographer, therapeutic.    2016, Elsevier/Gold Standard. (2014-05-29 00:00:00) Acetaminophen; Hydrocodone tablets or capsules Qu es este medicamento? ACETAMINOFENO; HIDROCODONA es un analgsico. Se utiliza para tratar los dolores moderados a severos. Este medicamento puede ser utilizado para otros usos; si tiene alguna pregunta consulte con su proveedor de atencin mdica o con su farmacutico. Qu le debo informar a mi profesional de la salud antes de tomar este medicamento? Necesita saber si usted presenta alguno de los [employer] o situaciones: -tumor cerebral -enfermedad de [place], enfermedad intestinal inflamatoria o colitis ulcerativa -abuso de drogas o drogadiccin -lesin de la cabeza -problemas cardiacos o circulatorios -si consume alcohol con frecuencia -enfermedad renal o problemas al orinar -enfermedad heptica -enfermedad pulmonar, asma o dificultades al respirar -una reaccin alrgica o inusual al acetaminofeno, a la hidrocodona, a otros analgsicos opiceos, a otros medicamentos, alimentos, colorantes o conservantes -si est embarazada o buscando quedar embarazada -si est amamantando a un beb Cmo debo utilizar este medicamento? Tome este medicamento por va oral. Ingiralo con un vaso  lleno de agua. Siga las instrucciones de la etiqueta del [place]. Si el Social worker, tmelo con alimentos o con [place]. No tome su medicamento con una frecuencia mayor que la indicada. Hable con su pediatra para informarse acerca del uso de este medicamento en nios. Este medicamento no est aprobado para uso en nios. Los [employer]  de 65 aos de edad pueden presentar reacciones ms fuertes y Pension scheme manager dosis [employer]. Sobredosis: Pngase en contacto inmediatamente con un centro toxicolgico o una sala de urgencia si usted cree que haya tomado  demasiado medicamento. ATENCIN: [employer] es solo para usted. No comparta este medicamento con nadie. Qu sucede si me olvido de una dosis? Si olvida una dosis, tmela lo antes posible. Si es casi la hora de la prxima dosis, tome slo esa dosis. No tome dosis adicionales o dobles. Qu puede interactuar con este medicamento? -alcohol -antihistamnicos -isoniazida -medicamentos para la depresin, ansiedad u trastornos psicticos -medicamentos para dormir -relajantes musculares -naltrexona -medicamentos narcticos (opiceos) para el dolor -fenobarbital -ritonavir -tramadol Puede ser que esta lista no menciona todas las posibles interacciones. Informe a su profesional de [employer] de [employer] productos a base de hierbas, medicamentos de [place] o suplementos nutritivos que est tomando. Si usted fuma, consume bebidas alcohlicas o si utiliza drogas ilegales, indqueselo tambin a su profesional de [employer]. Algunas sustancias pueden interactuar con su medicamento. A qu debo estar atento al usar [employer]? Si el dolor no desaparece, si empeora o si experimenta un dolor nuevo o de tipo diferente, consulte a su mdico o a su profesional de [employer]. Usted puede desarrollar tolerancia al medicamento. La tolerancia significa que necesitar una dosis ms alta para Engineer, materials. Tolerancia es normal y esperada cuando est tomando este medicamento por un largo perodo de [place]. No suspenda el uso de su medicamento repentinamente debido a que puede Copywriter, advertising reaccin severa. Su cuerpo se acostumbra a Industrial/product designer. Esto NO significa que sea adicto. La adiccin es un comportamiento que hace referencia a la obtencin y utilizacin de un medicamento con fines que no son mdicos. Si tiene Engineer, mining, existe una razn mdica para que usted tome un analgsico. Su mdico le indicar la cantidad de medicamento que Mudlogger. Si su mdico desea que [employer], la dosis  ser reducida gradualmente para Psychiatric nurse secudarios. Puede experimentar somnolencia o mareos al comenzar con el medicamento o al cambiar de dosis. No conduzca ni utilice maquinaria ni haga nada que sea peligroso hasta que sepa cmo le afecta este medicamento. Sintese y pngase de pie lentamente. Hay distintos tipos de medicamentos narcticos (opiceos) para Chief Technology Officer. Si usted toma ms que un tipo a la [employer], podr tener ms [employer]. Dar a su proveedor de atencin medica una lista de todos los medicamentos que usted [place]. Su mdico le informar la cantidad de medicamento que Loss adjuster, chartered. No tome ms medicamento que lo indicado. Comunquese con emergencia para ayuda si tiene problemas para respirar. Este medicamento causar estreimiento. Trate de evacuar los intestinos al menos cada 2  3 das. Si no evacua los intestinos durante 3 [address]  [address], comunquese con su mdico o con su profesional de [employer]. Tomando mucho acetaminofeno puede ser muy peligroso. No tome Tylenol (acetaminofeno) u otros medicamentos que contienen acetaminofeno con este medicamento. Muchos medicamentos de venta libre contienen acetaminofeno. Lea siempre las etiquetas cuidadosamente. Qu efectos secundarios puedo tener al [employer] este medicamento? Efectos secundarios que debe informar a su mdico o  a su profesional de la salud tan pronto como sea posible: -Therapist, art como erupcin cutnea, picazn o urticarias, hinchazn de la cara, labios o lengua -problemas respiratorios -confusin -sensacin de desmayos o mareos, cadas -dolor de estmago -color amarillento de los ojos o la piel Efectos secundarios que, por lo general, no requieren atencin mdica (debe informarlos a su mdico o a su profesional de la salud si persisten o si son molestos): -nuseas, vmitos -Programme researcher, broadcasting/film/video Puede ser que esta lista no menciona todos los posibles efectos secundarios. Comunquese a su mdico por asesoramiento  mdico [employer]. Usted puede informar los efectos secundarios a la FDA por telfono al 1-800-FDA-1088. Dnde debo guardar mi medicina? Mantngala fuera del alcance de los nios. Este medicamento puede ser abusado. Mantenga su medicamento en un lugar seguro para protegerlo contra robos. No comparta este medicamento con nadie. Es peligroso vender o ceder este medicamento y est prohibido por la ley. Gurdela a [employer], entre 15 y 30 grados C (13 y 43 grados F). Protjalo de Statistician. Mantenga el envase bien cerrado. Deseche todo el medicamento que no haya utilizado, despus de la fecha de vencimiento. Deseche los medicamentos sin Chemical engineer y los envases utilizados con cuidado. Los nios y las mascotas pueden ser daados si encuentran los envases usados o perdidos. ATENCIN: Este folleto es un resumen. Puede ser que no cubra toda la posible informacin. Si usted tiene preguntas acerca de esta medicina, consulte con su mdico, su farmacutico o su profesional de Radiographer, therapeutic.    2016, Elsevier/Gold Standard. (2014-05-28 00:00:00) Cyclobenzaprine tablets Qu es este medicamento? La CICLOBENZAPRINA es un relajante muscular. Se utiliza para tratar [employer] y la rigidez de los msculos y espasmos musculares. Este medicamento puede ser utilizado para otros usos; si tiene alguna pregunta consulte con su proveedor de atencin mdica o con su farmacutico. Qu le debo informar a mi profesional de la salud antes de tomar este medicamento? Necesita saber si usted presenta alguno de los siguientes problemas o situaciones: -enfermedad cardiaca, pulso cardiaco irregular o ataque cardiaco previo -enfermedad heptica -problemas tiroideos -una reaccin alrgica o inusual a la ciclobenzaprina, antidepresivos tricclicos, lactosa, a otros medicamentos, alimentos, colorantes o conservadores -si est embarazada o buscando quedar embarazada -si est amamantando a un beb Cmo debo utilizar  este medicamento? Tome este medicamento por va oral con un vaso de agua. Siga las instrucciones de la etiqueta del [place]. Si este Social worker, tmelo con alimentos o con [employer]. No tome su medicamento con una frecuencia mayor a la indicada. Hable con su pediatra para informarse acerca del uso de este medicamento en nios. Puede requerir atencin especial. Sobredosis: Pngase en contacto inmediatamente con un centro toxicolgico o una sala de urgencia si usted cree que haya tomado demasiado medicamento. ATENCIN: [employer] es solo para usted. No comparta este medicamento con nadie. Qu sucede si me olvido de una dosis? Si olvida una dosis, tmela lo antes posible. Si es casi la hora de su dosis siguiente, tome slo esa dosis. No tome dosis adicionales o dobles. Qu puede interactuar con este medicamento? No tome esta medicina con ninguno de los siguientes medicamentos: -ciertos medicamentos para infecciones micticas, tales como fluconazol, quetoconazol, itraconazol, posaconazol, voriconazol -cisapride -dofetilida -dronedarona -droperidol -flecainida -grepafloxacino -halofantrina -levometadilo -IMAOs tales como Carbex, Eldepryl, Marplan, Nardil y [place] -nilotinib -pimozida -probucol -sertindol -tioridazina -ziprasidona Esta medicina tambin puede interactuar con los siguientes medicamentos: -abarlix -alcohol -ciertos medicamentos para el cncer -ciertos medicamentos para la depresin, ansiedad  o trastornos psicticos -ciertos medicamentos para infecciones, tales como alfuzosn, cloroquina, claritromicina, levofloxacino, mefloquina, pentamidina, troleandomicina -ciertos medicamentos para el pulso cardiaco irregular -ciertos medicamentos usados para hacerle dormir o para entumecer durante una ciruga o procedimiento -colorantes de Arts development officer -dolasetrn -guanetidina -metadona -octreotida -ondansetrn -otros medicamentos que prolongan  el intervalo QT (causa un ritmo cardiaco anormal) -palonosetrn -fenotiazinas, tales como clorpromacina, mesoridazina, proclorperazina, tioridazina -tramadol -vardenafil Puede ser que esta lista no menciona todas las posibles interacciones. Informe a su profesional de [employer] de [employer] productos a base de hierbas, medicamentos de [place] o suplementos nutritivos que est tomando. Si usted fuma, consume bebidas alcohlicas o si utiliza drogas ilegales, indqueselo tambin a su profesional de [employer]. Algunas sustancias pueden interactuar con su medicamento. A qu debo estar atento al usar [employer]? Consulte a su mdico o a su profesional de la salud si su problema no mejora en 1 a 3 semanas. Puede experimentar somnolencia o mareos al comenzar con el medicamento o al cambiar de dosis. No conduzca ni utilice maquinaria, ni haga nada que sea peligroso hasta que sepa cmo le afecta este medicamento. Sintese y pngase de pie lentamente. Se le podr secar la boca. Masticar chicle sin azcar, chupar caramelos duros y beber agua le ayudarn a Pharmacologist la boca hmeda. Qu efectos secundarios puedo tener al [employer] este medicamento? Efectos secundarios que debe informar a su mdico o a Producer, television/film/video de la salud tan pronto como sea posible: -Therapist, art como erupcin cutnea, picazn o urticarias, hinchazn de la cara, labios o lengua -dolor en el pecho -pulso cardiaco rpido -alucinaciones -convulsiones -vmito Efectos secundarios que, por lo general, no requieren atencin mdica (debe informarlos a su mdico o a su profesional de la salud si persisten o si son molestos): -dolor de cabeza Puede ser que esta lista no menciona todos los posibles efectos secundarios. Comunquese a su mdico por asesoramiento mdico [employer]. Usted puede informar los efectos secundarios a la FDA por telfono al 1-800-FDA-1088. Dnde debo guardar mi medicina? Mantngala  fuera del alcance de los nios. Gurdela a [employer], entre 15 y 30 grados C (83 y 1 grados F). Mantenga el envase bien cerrado. Deseche todo el medicamento que no haya utilizado, despus de la fecha de vencimiento. ATENCIN: Este folleto es un resumen. Puede ser que no cubra toda la posible informacin. Si usted tiene preguntas acerca de esta medicina, consulte con su mdico, su farmacutico o su profesional de Radiographer, therapeutic.    2016, Elsevier/Gold Standard. (2014-05-28 00:00:00)

## 2016-05-19 ENCOUNTER — Emergency Department (HOSPITAL_COMMUNITY)
Admission: EM | Admit: 2016-05-19 | Discharge: 2016-05-19 | Disposition: A | Discharge status: Home / Self Care | Payer: Self-pay | Attending: Emergency Medicine | Admitting: Emergency Medicine

## 2016-05-19 ENCOUNTER — Encounter (HOSPITAL_COMMUNITY): Payer: Self-pay | Admitting: Emergency Medicine

## 2016-05-19 DIAGNOSIS — Z79899 Other long term (current) drug therapy: Secondary | ICD-10-CM | POA: Insufficient documentation

## 2016-05-19 DIAGNOSIS — R1084 Generalized abdominal pain: Secondary | ICD-10-CM | POA: Insufficient documentation

## 2016-05-19 DIAGNOSIS — R112 Nausea with vomiting, unspecified: Secondary | ICD-10-CM | POA: Insufficient documentation

## 2016-05-19 LAB — URINALYSIS, ROUTINE W REFLEX MICROSCOPIC
Bilirubin Urine: NEGATIVE
GLUCOSE, UA: NEGATIVE mg/dL
Hgb urine dipstick: NEGATIVE
KETONES UR: NEGATIVE mg/dL
LEUKOCYTES UA: NEGATIVE
NITRITE: NEGATIVE
PROTEIN: NEGATIVE mg/dL
Specific Gravity, Urine: 1.01 (ref 1.005–1.030)
pH: 7 (ref 5.0–8.0)

## 2016-05-19 LAB — CBC
HEMATOCRIT: 42.9 % (ref 39.0–52.0)
Hemoglobin: 14.7 g/dL (ref 13.0–17.0)
MCH: 30.3 pg (ref 26.0–34.0)
MCHC: 34.3 g/dL (ref 30.0–36.0)
MCV: 88.5 fL (ref 78.0–100.0)
PLATELETS: 263 10*3/uL (ref 150–400)
RBC: 4.85 MIL/uL (ref 4.22–5.81)
RDW: 12.5 % (ref 11.5–15.5)
WBC: 5.4 10*3/uL (ref 4.0–10.5)

## 2016-05-19 LAB — COMPREHENSIVE METABOLIC PANEL
ALT: 51 U/L (ref 17–63)
AST: 47 U/L — ABNORMAL HIGH (ref 15–41)
Albumin: 4.5 g/dL (ref 3.5–5.0)
Alkaline Phosphatase: 83 U/L (ref 38–126)
Anion gap: 7 (ref 5–15)
BILIRUBIN TOTAL: 0.9 mg/dL (ref 0.3–1.2)
BUN: 13 mg/dL (ref 6–20)
CHLORIDE: 105 mmol/L (ref 101–111)
CO2: 27 mmol/L (ref 22–32)
CREATININE: 0.87 mg/dL (ref 0.61–1.24)
Calcium: 10 mg/dL (ref 8.9–10.3)
GFR calc non Af Amer: >60 mL/min (ref >60)
Glucose, Bld: 112 mg/dL — ABNORMAL HIGH (ref 65–99)
POTASSIUM: 4.4 mmol/L (ref 3.5–5.1)
Sodium: 139 mmol/L (ref 135–145)
TOTAL PROTEIN: 7.3 g/dL (ref 6.5–8.1)

## 2016-05-19 LAB — LIPASE, BLOOD: LIPASE: 20 U/L (ref 11–51)

## 2016-05-19 MED ORDER — DICYCLOMINE HCL 10 MG PO CAPS
10.0000 mg | ORAL_CAPSULE | Freq: Once | ORAL | Status: AC
  Administered 2016-05-19: 10 mg via ORAL
  Filled 2016-05-19: qty 1

## 2016-05-19 MED ORDER — ONDANSETRON HCL 4 MG PO TABS: 4.0000 mg | ORAL_TABLET | Freq: Four times a day (QID) | ORAL | 0 refills | Status: AC

## 2016-05-19 MED ORDER — ONDANSETRON 4 MG PO TBDP
4.0000 mg | ORAL_TABLET | Freq: Once | ORAL | Status: AC
  Administered 2016-05-19: 4 mg via ORAL
  Filled 2016-05-19: qty 1

## 2016-05-19 MED ORDER — DICYCLOMINE HCL 20 MG PO TABS: 20.0000 mg | ORAL_TABLET | Freq: Two times a day (BID) | ORAL | 0 refills | Status: AC

## 2016-05-19 NOTE — ED Notes (Signed)
Pt request something for pain, told pt I would talk to RN.

## 2016-05-19 NOTE — Discharge Instructions (Signed)
Take your medications as prescribed. Stay well-hydrated as we discussed. Follow up with your doctor as needed. Return to ED for new or worsening symptoms as we discussed

## 2016-05-19 NOTE — ED Notes (Signed)
Gave pt crackers and water for PO challenge 

## 2016-05-19 NOTE — ED Triage Notes (Signed)
abd pain  Since Monday started left side now on the rrt having n/v/d

## 2016-05-19 NOTE — ED Provider Notes (Signed)
[facility]-EMERGENCY DEPT Provider Note   CSN: [id number] Arrival date & time: 05/19/16  1033     History   Chief Complaint Chief Complaint  Patient presents with  . Abdominal Pain  . Emesis    HPI Nil Xiong is a 36 y.o. male.  HPI status post appendectomy, here for evaluation of diffuse abdominal pain, nausea and vomiting. Patient poor symptoms started on Monday evening. He reports feeling like "a ball in the middle of my stomach". He reports his discomfort is worse when he lies on either side, but is better when he lies flat. He reports associated nausea and vomiting, nonbloody and non-bilious. He has taken Alka-Seltzer without relief. Last bowel movement was this morning and normal for him. Reports subjective tactile fever last night, but has resolved. Denies any chills, chest pain, shortness of breath, diarrhea or constipation, urinary symptoms, rash.  History reviewed. No pertinent past medical history.  There are no active problems to display for this patient.   Past Surgical History:  Procedure Laterality Date  . APPENDECTOMY    . HERNIA REPAIR         Home Medications    Prior to Admission medications   Medication Sig Start Date End Date Taking? Authorizing Provider  ibuprofen (ADVIL,MOTRIN) 800 MG tablet Take 800 mg by mouth every 8 (eight) hours as needed for headache or moderate pain.   Yes Historical Provider, MD  cyclobenzaprine (FLEXERIL) 10 MG tablet Take 1 tablet (10 mg total) by mouth 2 (two) times daily as needed for muscle spasms. Patient not taking: Reported on 05/19/2016 06/18/15   Arthor Captain, PA-C  dicyclomine (BENTYL) 20 MG tablet Take 1 tablet (20 mg total) by mouth 2 (two) times daily. 05/19/16   Joycie Peek, PA-C  HYDROcodone-acetaminophen (NORCO) 5-325 MG tablet Take 1 tablet by mouth every 4 (four) hours as needed for severe pain. Patient not taking: Reported on 05/19/2016 06/18/15   Arthor Captain, PA-C  naproxen (NAPROSYN) 500 MG tablet  Take 1 tablet (500 mg total) by mouth 2 (two) times daily with a meal. Patient not taking: Reported on 05/19/2016 06/18/15   Arthor Captain, PA-C  ondansetron (ZOFRAN) 4 MG tablet Take 1 tablet (4 mg total) by mouth every 6 (six) hours. 05/19/16   Joycie Peek, PA-C    Family History No family history on file.  Social History Social History  Substance Use Topics  . Smoking status: Never Smoker  . Smokeless tobacco: Not on file  . Alcohol use No     Allergies   Patient has no known allergies.   Review of Systems Review of Systems A 10 point review of systems was completed and was negative except for pertinent positives and negatives as mentioned in the history of present illness    Physical Exam Updated Vital Signs BP 110/74   Pulse 60   Temp 98.1 F (36.7 C) (Oral)   Resp 15   SpO2 98%   Physical Exam  Constitutional: He appears well-developed and well-nourished. No distress.  Awake, alert and nontoxic in appearance  HENT:  Head: Normocephalic and atraumatic.  Right Ear: External ear normal.  Left Ear: External ear normal.  Mouth/Throat: Oropharynx is clear and moist.  Eyes: Conjunctivae and EOM are normal. Pupils are equal, round, and reactive to light.  Neck: Normal range of motion. No JVD present.  Cardiovascular: Normal rate, regular rhythm and normal heart sounds.   Pulmonary/Chest: Effort normal and breath sounds normal. No stridor.  Abdominal: Soft.  Mild diffuse  abdominal tenderness, no focal pain. No peritoneal signs. No distention, rebound or guarding. No pulsatile or palpable masses  Musculoskeletal: Normal range of motion.  Neurological:  Awake, alert, cooperative and aware of situation; motor strength bilaterally; sensation normal to light touch bilaterally; no facial asymmetry; tongue midline; major cranial nerves appear intact;  baseline gait without new ataxia.  Skin: No rash noted. He is not diaphoretic.  Psychiatric: He has a normal mood and  affect. His behavior is normal. Thought content normal.  Nursing note and vitals reviewed.  Vitals:   05/19/16 1122 05/19/16 1130 05/19/16 1215 05/19/16 1300  BP: 116/84 121/81 113/63 110/74  Pulse: 68 64 65 60  Resp: 15     Temp:      TempSrc:      SpO2: 100% 99% 99% 98%     ED Treatments / Results  Labs (all labs ordered are listed, but only abnormal results are displayed) Labs Reviewed  COMPREHENSIVE METABOLIC PANEL - Abnormal; Notable for the following:       Result Value   Glucose, Bld 112 (*)    AST 47 (*)    All other components within normal limits  LIPASE, BLOOD  CBC  URINALYSIS, ROUTINE W REFLEX MICROSCOPIC    EKG  EKG Interpretation None       Radiology No results found.  Procedures Procedures (including critical care time)  Medications Ordered in ED Medications  ondansetron (ZOFRAN-ODT) disintegrating tablet 4 mg (4 mg Oral Given 05/19/16 1306)  dicyclomine (BENTYL) capsule 10 mg (10 mg Oral Given 05/19/16 1306)     Initial Impression / Assessment and Plan / ED Course  I have reviewed the triage vital signs and the nursing notes.  Pertinent labs & imaging results that were available during my care of the patient were reviewed by me and considered in my medical decision making (see chart for details).     Patient appears very well, exam was reassuring, hemodynamically stable. Labs, urine are all reassuring. Suspect symptoms are viral in etiology. Tolerating oral fluids and crackers in ED. States feels much better after Zofran and Bentyl. Will treat symptomatically. Given prescription for Zofran, Bentyl. Discussed oral rehydration at home, strict return precautions. He verbalizes understanding, agrees with plan and subsequent discharge.  Final Clinical Impressions(s) / ED Diagnoses   Final diagnoses:  Non-intractable vomiting with nausea, unspecified vomiting type  Generalized abdominal discomfort    New Prescriptions New Prescriptions    DICYCLOMINE (BENTYL) 20 MG TABLET    Take 1 tablet (20 mg total) by mouth 2 (two) times daily.   ONDANSETRON (ZOFRAN) 4 MG TABLET    Take 1 tablet (4 mg total) by mouth every 6 (six) hours.     Joycie PeekBenjamin Yehia Mcbain, PA-C 05/19/16 1405    Mancel BaleElliott Wentz, MD 05/19/16 [id number]

## 2016-10-25 ENCOUNTER — Encounter (HOSPITAL_COMMUNITY): Payer: Self-pay | Admitting: Emergency Medicine

## 2016-10-25 ENCOUNTER — Emergency Department (HOSPITAL_COMMUNITY)
Admission: EM | Admit: 2016-10-25 | Discharge: 2016-10-25 | Disposition: A | Discharge status: Home / Self Care | Payer: Self-pay | Attending: Physician Assistant | Admitting: Physician Assistant

## 2016-10-25 ENCOUNTER — Emergency Department (HOSPITAL_COMMUNITY): Payer: Self-pay

## 2016-10-25 DIAGNOSIS — Z79899 Other long term (current) drug therapy: Secondary | ICD-10-CM | POA: Insufficient documentation

## 2016-10-25 DIAGNOSIS — M25572 Pain in left ankle and joints of left foot: Secondary | ICD-10-CM | POA: Insufficient documentation

## 2016-10-25 DIAGNOSIS — Z7983 Long term (current) use of bisphosphonates: Secondary | ICD-10-CM | POA: Insufficient documentation

## 2016-10-25 DIAGNOSIS — Z791 Long term (current) use of non-steroidal anti-inflammatories (NSAID): Secondary | ICD-10-CM | POA: Insufficient documentation

## 2016-10-25 DIAGNOSIS — M79672 Pain in left foot: Secondary | ICD-10-CM | POA: Insufficient documentation

## 2016-10-25 NOTE — ED Provider Notes (Signed)
[facility]ER[facility]Y DEPT Provider Note   CSN: [id number] Arrival date & time: 10/25/16  1510  By signing my name below, I, [name], attest that this documentation has been prepared under the direction and in the presence of [name], PA-C. Electronically Signed: [name], ED Scribe. 10/25/16. 5:56 PM.  History   Chief Complaint Chief Complaint  Patient presents with  . Foot Pain   The history is provided by the patient. No language interpreter was used.   HPI Comments: Andres Alvarez is a 36 y.o. male who presents to the Emergency Department complaining of bilateral foot pain that began 10/23/2016. The left foot is more painflul than the right, but flexing and rotating both ankles exacerbates his pain. The pt denies previous injury and fall. Denies any numbness or weakness. Ambulation and palpating the area exacerbates his pain. The pt has not had similar symptoms in the past.  No medications tried prior to arrival. He denies fever.  History reviewed. No pertinent past medical history.  There are no active problems to display for this patient.   Past Surgical History:  Procedure Laterality Date  . APPENDECTOMY    . HERNIA REPAIR         Home Medications    Prior to Admission medications   Medication Sig Start Date End Date Taking? Authorizing Provider  cyclobenzaprine (FLEXERIL) 10 MG tablet Take 1 tablet (10 mg total) by mouth 2 (two) times daily as needed for muscle spasms. Patient not taking: Reported on 05/19/2016 06/18/15   Arthor CaptainHarris, Abigail, PA-C  dicyclomine (BENTYL) 20 MG tablet Take 1 tablet (20 mg total) by mouth 2 (two) times daily. 05/19/16   Cartner, Sharlet SalinaBenjamin, PA-C  HYDROcodone-acetaminophen (NORCO) 5-325 MG tablet Take 1 tablet by mouth every 4 (four) hours as needed for severe pain. Patient not taking: Reported on 05/19/2016 06/18/15   Arthor CaptainHarris, Abigail, PA-C  ibuprofen (ADVIL,MOTRIN) 800 MG tablet Take 800 mg by mouth every 8 (eight) hours as needed for  headache or moderate pain.    [provider]  naproxen (NAPROSYN) 500 MG tablet Take 1 tablet (500 mg total) by mouth 2 (two) times daily with a meal. Patient not taking: Reported on 05/19/2016 06/18/15   Arthor CaptainHarris, Abigail, PA-C  ondansetron (ZOFRAN) 4 MG tablet Take 1 tablet (4 mg total) by mouth every 6 (six) hours. 05/19/16   Joycie Peekartner, Benjamin, PA-C    Family History No family history on file.  Social History Social History  Substance Use Topics  . Smoking status: Never Smoker  . Smokeless tobacco: Never Used  . Alcohol use No     Allergies   Patient has no known allergies.   Review of Systems Review of Systems  Constitutional: Negative for fever.  Gastrointestinal: Negative for nausea and vomiting.  Musculoskeletal: Positive for arthralgias and myalgias.  Skin: Negative for color change and rash.     Physical Exam Updated Vital Signs BP (!) 123/92 (BP Location: Left Arm)   Pulse 62   Temp 98.4 F (36.9 C)   Resp 16   Ht 5' 5.5" (1.664 m)   Wt 74.4 kg (164 lb)   SpO2 99%   BMI 26.88 kg/m   Physical Exam  Constitutional: He appears well-developed and well-nourished. No distress.  HENT:  Head: Normocephalic and atraumatic.  Eyes: Conjunctivae and EOM are normal. Right eye exhibits no discharge. Left eye exhibits no discharge. No scleral icterus.  Neck: Normal range of motion.  Cardiovascular:  Pulses:      Dorsalis pedis  pulses are 2+ on the right side, and 2+ on the left side.  2+ DP pulses bilaterally  Pulmonary/Chest: Effort normal. No respiratory distress.  Musculoskeletal: He exhibits tenderness.       Feet:  Tenderness to palpation of bilateral ankles and encaved areas. No visible edema, or deformities noted. No color changes or temperature changes. Strength 5/5 in lower extremities. No calf tenderness.  Neurological: He is alert.  Skin: Skin is warm and dry. No rash noted. He is not diaphoretic.  Psychiatric: He has a normal mood and affect.  His speech is normal and behavior is normal.  Nursing note and vitals reviewed.    ED Treatments / Results   DIAGNOSTIC STUDIES: Oxygen Saturation is 98% on RA, normal by my interpretation.   COORDINATION OF CARE: 5:48 PM-Discussed next steps with pt. Pt verbalized understanding and is agreeable with the plan.   Labs (all labs ordered are listed, but only abnormal results are displayed) Labs Reviewed - No data to display  EKG  EKG Interpretation None       Radiology Dg Ankle Complete Left  Result Date: 10/25/2016 CLINICAL DATA:  Bilateral foot pain since October 23, 2016 EXAM: LEFT ANKLE COMPLETE - 3+ VIEW COMPARISON:  None. FINDINGS: There is no evidence of fracture, dislocation, or joint effusion. There is no evidence of arthropathy or other focal bone abnormality. Soft tissues are unremarkable. IMPRESSION: Negative. Electronically Signed   By: Sherian Rein M.D.   On: 10/25/2016 18:32   Dg Ankle Complete Right  Result Date: 10/25/2016 CLINICAL DATA:  BILATERAL foot pain.  No injury. EXAM: RIGHT ANKLE - COMPLETE 3+ VIEW COMPARISON:  None. FINDINGS: There is no evidence of fracture, dislocation, or joint effusion. There is no evidence of arthropathy or other focal bone abnormality. Small phlebolith anterior soft tissues. IMPRESSION: Negative. Electronically Signed   By: Elsie Stain M.D.   On: 10/25/2016 18:37    Procedures Procedures (including critical care time)  Medications Ordered in ED Medications - No data to display   Initial Impression / Assessment and Plan / ED Course  I have reviewed the triage vital signs and the nursing notes.  Pertinent labs & imaging results that were available during my care of the patient were reviewed by me and considered in my medical decision making (see chart for details).     Patient presents to ED for bilateral ankle pain that began 2 days ago. He denies any injury, accident or trauma that brought the pain on. He denies any  numbness, weakness or systemic symptoms. He is afebrile with no history of fever. On physical exam he has tenderness to palpation of the indicated areas above on bilateral ankles. There is no visible edema, deformity or bruising noted. There is no temperature change noted. Sensation intact to light touch with good pulses and strength. Low suspicion for septic joint, fracture or dislocation. There are no sites of inoculation of foreign body. X-rays of bilateral ankles were obtained and were negative. Patient given ASO ankle for a left ankle here in the ED, as he states that this one is worse. I encouraged him to take anti-inflammatories, apply heat and stretch area as tolerated. Patient declined crutches at this time. I advised him to return to ED for any severe or worsening symptoms. Patient appears stable for discharge at this time. Strict return precautions given.  Final Clinical Impressions(s) / ED Diagnoses   Final diagnoses:  Foot pain, left  Acute left ankle pain  New Prescriptions Discharge Medication List as of 10/25/2016  7:05 PM     I personally performed the services described in this documentation, which was scribed in my presence. The recorded information has been reviewed and is accurate.     Dietrich Pates, PA-C 10/25/16 1937    Abelino Derrick, MD 10/25/16 2101

## 2016-10-25 NOTE — ED Notes (Signed)
Pt verbalized understanding of d/c instructions and has no further questions. Pt is stable, A&Ox4, VSS.  

## 2016-10-25 NOTE — Discharge Instructions (Signed)
Please read attached information regarding your condition. Take ibuprofen or Aleve for pain and inflammation. Apply heat and stretch area as tolerated. Return to ED for worsening pain, trouble walking, numbness, weakness, injury or falls.

## 2016-10-25 NOTE — ED Notes (Signed)
Ortho tech called 

## 2016-10-25 NOTE — ED Triage Notes (Signed)
Pt. Stated, since last Tuesday ive not been able to flex my foot or go side to side. Its very painful the foot.

## 2016-10-25 NOTE — Progress Notes (Signed)
Orthopedic Tech Progress Note Patient Details:  [name] 07/16/1980 [id number]  Ortho Devices Type of Ortho Device: ASO Ortho Device/Splint Location: Left ankle/foot Ortho Device/Splint Interventions: Application, Adjustment   Alvina ChouWilliams, Kealy Lewter C 10/25/2016, 7:19 PM

## 2017-06-11 ENCOUNTER — Emergency Department (HOSPITAL_COMMUNITY): Payer: No Typology Code available for payment source

## 2017-06-11 ENCOUNTER — Emergency Department (HOSPITAL_COMMUNITY)
Admission: EM | Admit: 2017-06-11 | Discharge: 2017-06-11 | Disposition: A | Discharge status: Home / Self Care | Payer: No Typology Code available for payment source | Attending: Emergency Medicine | Admitting: Emergency Medicine

## 2017-06-11 ENCOUNTER — Encounter (HOSPITAL_COMMUNITY): Payer: Self-pay | Admitting: Emergency Medicine

## 2017-06-11 DIAGNOSIS — M79602 Pain in left arm: Secondary | ICD-10-CM | POA: Insufficient documentation

## 2017-06-11 DIAGNOSIS — M542 Cervicalgia: Secondary | ICD-10-CM | POA: Diagnosis present

## 2017-06-11 DIAGNOSIS — M545 Low back pain: Secondary | ICD-10-CM | POA: Diagnosis not present

## 2017-06-11 DIAGNOSIS — Z79899 Other long term (current) drug therapy: Secondary | ICD-10-CM | POA: Insufficient documentation

## 2017-06-11 DIAGNOSIS — M25561 Pain in right knee: Secondary | ICD-10-CM | POA: Diagnosis not present

## 2017-06-11 DIAGNOSIS — M25562 Pain in left knee: Secondary | ICD-10-CM | POA: Insufficient documentation

## 2017-06-11 MED ORDER — NAPROXEN 500 MG PO TABS
500.0000 mg | ORAL_TABLET | Freq: Once | ORAL | Status: AC
  Administered 2017-06-11: 500 mg via ORAL
  Filled 2017-06-11: qty 1

## 2017-06-11 MED ORDER — METHOCARBAMOL 500 MG PO TABS: 500.0000 mg | ORAL_TABLET | Freq: Three times a day (TID) | ORAL | 0 refills | Status: AC | PRN

## 2017-06-11 MED ORDER — NAPROXEN 500 MG PO TABS: 500.0000 mg | ORAL_TABLET | Freq: Two times a day (BID) | ORAL | 0 refills | Status: AC
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
## 2017-06-11 NOTE — ED Notes (Signed)
Patient transported to X-ray 
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
## 2021-12-05 ENCOUNTER — Other Ambulatory Visit: Payer: Self-pay

## 2021-12-05 ENCOUNTER — Encounter (HOSPITAL_COMMUNITY): Payer: Self-pay

## 2021-12-05 ENCOUNTER — Emergency Department (HOSPITAL_COMMUNITY): Payer: Self-pay

## 2021-12-05 ENCOUNTER — Emergency Department (HOSPITAL_COMMUNITY)
Admission: EM | Admit: 2021-12-05 | Discharge: 2021-12-06 | Payer: Self-pay | Attending: Emergency Medicine | Admitting: Emergency Medicine

## 2021-12-05 DIAGNOSIS — M20032 Swan-neck deformity of left finger(s): Secondary | ICD-10-CM

## 2021-12-05 DIAGNOSIS — M79642 Pain in left hand: Secondary | ICD-10-CM | POA: Insufficient documentation
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
## 2021-12-06 NOTE — Progress Notes (Signed)
Orthopedic Tech Progress Note Patient Details:  Andres Alvarez [birth year] [id number]  Ortho Devices Type of Ortho Device: Finger splint Ortho Device/Splint Location: i applie splint as directed to by dr Gaylord Shih Device/Splint Interventions: Ordered, Application, Adjustment   Post Interventions Patient Tolerated: Well Instructions Provided: Care of device, Adjustment of device  [name] 12/06/2021, 1:55 AM

## 2021-12-06 NOTE — Discharge Instructions (Addendum)
It was a pleasure taking care of you!   Your x-ray was negative for fracture or dislocation.  You may take over the counter 600 mg Ibuprofen every 6 hours or 500 mg Tylenol every 6 hours as needed for pain for no more than 7 days. You will be given a splint today. Wear it during the day, you may remove it at night. You may apply ice or heat to affected area for up to 15 minutes at a time. Ensure to place a barrier between your skin and the ice/heat.  You may follow-up with your primary care provider as needed.  Return to the Emergency Department if you are experiencing increasing/worsening pain, swelling, color change, fever, or worsening symptoms.
